# Patient Record
Sex: Male | Born: 1965 | Race: White | Hispanic: No | Marital: Married | Smoking: Never smoker
Health system: Southern US, Community
[De-identification: ages and names within clinical notes are randomized; demographics above are authoritative.]

## PROBLEM LIST (undated history)

## (undated) DIAGNOSIS — I10 Essential (primary) hypertension: Secondary | ICD-10-CM

## (undated) DIAGNOSIS — F329 Major depressive disorder, single episode, unspecified: Secondary | ICD-10-CM

## (undated) DIAGNOSIS — F419 Anxiety disorder, unspecified: Secondary | ICD-10-CM

## (undated) DIAGNOSIS — M4850XA Collapsed vertebra, not elsewhere classified, site unspecified, initial encounter for fracture: Secondary | ICD-10-CM

## (undated) DIAGNOSIS — G4700 Insomnia, unspecified: Secondary | ICD-10-CM

## (undated) DIAGNOSIS — J309 Allergic rhinitis, unspecified: Secondary | ICD-10-CM

## (undated) DIAGNOSIS — F32A Depression, unspecified: Secondary | ICD-10-CM

## (undated) HISTORY — DX: Essential (primary) hypertension: I10

## (undated) HISTORY — PX: TONSILLECTOMY: SHX5217

## (undated) HISTORY — DX: Insomnia, unspecified: G47.00

## (undated) HISTORY — DX: Anxiety disorder, unspecified: F41.9

## (undated) HISTORY — DX: Major depressive disorder, single episode, unspecified: F32.9

## (undated) HISTORY — PX: FINGER REPLANTATION: SHX639

## (undated) HISTORY — DX: Allergic rhinitis, unspecified: J30.9

## (undated) HISTORY — DX: Depression, unspecified: F32.A

## (undated) HISTORY — DX: Collapsed vertebra, not elsewhere classified, site unspecified, initial encounter for fracture: M48.50XA

---

## 2005-06-07 ENCOUNTER — Emergency Department: Payer: Self-pay | Admitting: Emergency Medicine

## 2006-07-02 ENCOUNTER — Emergency Department: Payer: Self-pay | Admitting: Unknown Physician Specialty

## 2015-04-25 ENCOUNTER — Other Ambulatory Visit: Payer: Self-pay

## 2015-04-25 ENCOUNTER — Telehealth: Payer: Self-pay | Admitting: Unknown Physician Specialty

## 2015-04-25 MED ORDER — SERTRALINE HCL 100 MG PO TABS: 100.0000 mg | ORAL_TABLET | Freq: Every day | ORAL | Status: DC

## 2015-04-25 NOTE — Telephone Encounter (Signed)
E-fax came through for refill on: Rx: Sertraline Copy in basket.

## 2015-04-26 NOTE — Telephone Encounter (Signed)
Duplicate message. 

## 2015-08-24 ENCOUNTER — Other Ambulatory Visit: Payer: Self-pay | Admitting: Unknown Physician Specialty

## 2015-09-29 ENCOUNTER — Other Ambulatory Visit: Payer: Self-pay | Admitting: Unknown Physician Specialty

## 2015-12-18 ENCOUNTER — Emergency Department: Payer: BLUE CROSS/BLUE SHIELD

## 2015-12-18 ENCOUNTER — Emergency Department
Admission: EM | Admit: 2015-12-18 | Discharge: 2015-12-19 | Disposition: A | Discharge status: Home / Self Care | Payer: BLUE CROSS/BLUE SHIELD | Attending: Emergency Medicine | Admitting: Emergency Medicine

## 2015-12-18 ENCOUNTER — Encounter: Payer: Self-pay | Admitting: Emergency Medicine

## 2015-12-18 DIAGNOSIS — R109 Unspecified abdominal pain: Secondary | ICD-10-CM | POA: Diagnosis present

## 2015-12-18 DIAGNOSIS — Z79899 Other long term (current) drug therapy: Secondary | ICD-10-CM | POA: Diagnosis not present

## 2015-12-18 DIAGNOSIS — M545 Low back pain: Secondary | ICD-10-CM | POA: Diagnosis not present

## 2015-12-18 DIAGNOSIS — F17200 Nicotine dependence, unspecified, uncomplicated: Secondary | ICD-10-CM | POA: Insufficient documentation

## 2015-12-18 LAB — BASIC METABOLIC PANEL
ANION GAP: 6 (ref 5–15)
BUN: 21 mg/dL — ABNORMAL HIGH (ref 6–20)
CALCIUM: 8.9 mg/dL (ref 8.9–10.3)
CO2: 27 mmol/L (ref 22–32)
CREATININE: 1.04 mg/dL (ref 0.61–1.24)
Chloride: 102 mmol/L (ref 101–111)
Glucose, Bld: 120 mg/dL — ABNORMAL HIGH (ref 65–99)
Potassium: 4 mmol/L (ref 3.5–5.1)
SODIUM: 135 mmol/L (ref 135–145)

## 2015-12-18 LAB — URINALYSIS COMPLETE WITH MICROSCOPIC (ARMC ONLY)
Bacteria, UA: NONE SEEN
Bilirubin Urine: NEGATIVE
GLUCOSE, UA: NEGATIVE mg/dL
KETONES UR: NEGATIVE mg/dL
Leukocytes, UA: NEGATIVE
Nitrite: NEGATIVE
PROTEIN: NEGATIVE mg/dL
SQUAMOUS EPITHELIAL / LPF: NONE SEEN
Specific Gravity, Urine: 1.011 (ref 1.005–1.030)
pH: 6 (ref 5.0–8.0)

## 2015-12-18 LAB — CBC
HCT: 50.1 % (ref 40.0–52.0)
HEMOGLOBIN: 16.6 g/dL (ref 13.0–18.0)
MCH: 30.7 pg (ref 26.0–34.0)
MCHC: 33 g/dL (ref 32.0–36.0)
MCV: 92.9 fL (ref 80.0–100.0)
PLATELETS: 246 10*3/uL (ref 150–440)
RBC: 5.39 MIL/uL (ref 4.40–5.90)
RDW: 15.4 % — ABNORMAL HIGH (ref 11.5–14.5)
WBC: 12.8 10*3/uL — AB (ref 3.8–10.6)

## 2015-12-18 MED ORDER — SODIUM CHLORIDE 0.9 % IV SOLN
1000.0000 mL | Freq: Once | INTRAVENOUS | Status: AC
  Administered 2015-12-18: 1000 mL via INTRAVENOUS

## 2015-12-18 MED ORDER — IOPAMIDOL (ISOVUE-300) INJECTION 61%
100.0000 mL | Freq: Once | INTRAVENOUS | Status: AC | PRN
  Administered 2015-12-18: 100 mL via INTRAVENOUS

## 2015-12-18 MED ORDER — HYDROMORPHONE HCL 1 MG/ML IJ SOLN
INTRAMUSCULAR | Status: AC
  Administered 2015-12-18: 1 mg via INTRAVENOUS
  Filled 2015-12-18: qty 1

## 2015-12-18 MED ORDER — HYDROMORPHONE HCL 1 MG/ML IJ SOLN
1.0000 mg | Freq: Once | INTRAMUSCULAR | Status: AC
  Administered 2015-12-18: 1 mg via INTRAVENOUS
  Filled 2015-12-18: qty 1

## 2015-12-18 MED ORDER — ONDANSETRON HCL 4 MG/2ML IJ SOLN
4.0000 mg | Freq: Once | INTRAMUSCULAR | Status: AC
  Administered 2015-12-18: 4 mg via INTRAVENOUS
  Filled 2015-12-18: qty 2

## 2015-12-18 MED ORDER — OXYCODONE-ACETAMINOPHEN 7.5-325 MG PO TABS
1.0000 | ORAL_TABLET | ORAL | Status: AC | PRN
Start: 2015-12-18 — End: 2016-12-17

## 2015-12-18 MED ORDER — DEXTROSE 5 % IV SOLN
1.0000 g | Freq: Once | INTRAVENOUS | Status: AC
  Administered 2015-12-19: 1 g via INTRAVENOUS
  Filled 2015-12-18: qty 10

## 2015-12-18 MED ORDER — HYDROMORPHONE HCL 1 MG/ML IJ SOLN
1.0000 mg | Freq: Once | INTRAMUSCULAR | Status: AC
  Administered 2015-12-18: 1 mg via INTRAVENOUS

## 2015-12-18 MED ORDER — DIATRIZOATE MEGLUMINE & SODIUM 66-10 % PO SOLN
15.0000 mL | Freq: Once | ORAL | Status: AC
  Administered 2015-12-18: 15 mL via ORAL

## 2015-12-18 MED ORDER — DIAZEPAM 5 MG PO TABS: 5.0000 mg | ORAL_TABLET | Freq: Three times a day (TID) | ORAL | Status: DC | PRN

## 2015-12-18 MED ORDER — CIPROFLOXACIN HCL 500 MG PO TABS: 500.0000 mg | ORAL_TABLET | Freq: Two times a day (BID) | ORAL | Status: AC

## 2015-12-18 NOTE — ED Notes (Signed)
Called CT to inform them pt has completed the oral contrast.

## 2015-12-18 NOTE — Discharge Instructions (Signed)

## 2015-12-18 NOTE — ED Notes (Signed)
Pt states right flank pain that began yesterday. Pt appears in distress in triage. Pt states pain is "intense" and constant. Pt states last urination approx one hour pta. Pt denies nausea, dizziness, diarrhea, fever.

## 2015-12-18 NOTE — ED Notes (Signed)
Patient transported to CT 

## 2015-12-18 NOTE — ED Notes (Signed)
Discussed IV options with patient, AC placement preferred. 

## 2015-12-18 NOTE — ED Provider Notes (Signed)
[facility] Emergency Department Provider Note     Time seen: ----------------------------------------- 8:53 PM on 12/18/2015 -----------------------------------------    I have reviewed the triage vital signs and the nursing notes.   HISTORY  Chief Complaint Flank Pain    HPI Hector Buck is a 50 y.o. male who presents to ER with severe left flank pain that began yesterday. Patient describes it as intense and constant. Nothing makes it better or worse. Patient last urinated about 1 hour prior to arrival. He denies any nausea, dizziness, diarrhea or fever. Patient has never had pain like this before, states he had a prostate infection years ago but has never had symptoms consistent with what he has now.   History reviewed. No pertinent past medical history.  There are no active problems to display for this patient.   History reviewed. No pertinent past surgical history.  Allergies Review of patient's allergies indicates no known allergies.  Social History Social History  Substance Use Topics  . Smoking status: Never Smoker   . Smokeless tobacco: Current User  . Alcohol Use: No    Review of Systems Constitutional: Negative for fever. Eyes: Negative for visual changes. ENT: Negative for sore throat. Cardiovascular: Negative for chest pain. Respiratory: Negative for shortness of breath. Gastrointestinal: Positive for left flank pain Genitourinary: Negative for dysuria. Musculoskeletal: Negative for back pain. Skin: Negative for rash. Neurological: Negative for headaches, focal weakness or numbness.  10-point ROS otherwise negative.  ____________________________________________   PHYSICAL EXAM:  VITAL SIGNS: ED Triage Vitals  Enc Vitals Group     BP 12/18/15 2046 191/106 mmHg     Pulse Rate 12/18/15 2046 110     Resp 12/18/15 2046 26     Temp 12/18/15 2046 98.9 F (37.2 C)     Temp Source 12/18/15 2046 Oral     SpO2 12/18/15  2046 100 %     Weight 12/18/15 2046 204 lb (92.534 kg)     Height 12/18/15 2046 5\' 8"  (1.727 m)     Head Cir --      Peak Flow --      Pain Score 12/18/15 2046 10     Pain Loc --      Pain Edu? --      Excl. in GC? --     Constitutional: Alert and oriented.Mild to moderate distress Eyes: Conjunctivae are normal. PERRL. Normal extraocular movements. ENT   Head: Normocephalic and atraumatic.   Nose: No congestion/rhinnorhea.   Mouth/Throat: Mucous membranes are moist.   Neck: No stridor. Cardiovascular: Normal rate, regular rhythm. Normal and symmetric distal pulses are present in all extremities. No murmurs, rubs, or gallops. Respiratory: Normal respiratory effort without tachypnea nor retractions. Breath sounds are clear and equal bilaterally. No wheezes/rales/rhonchi. Gastrointestinal: Mild left flank tenderness, no rebound or guarding. Normal bowel sounds. Musculoskeletal: Nontender with normal range of motion in all extremities. No joint effusions.  No lower extremity tenderness nor edema. Neurologic:  Normal speech and language. No gross focal neurologic deficits are appreciated.  Skin:  Skin is warm, dry and intact. No rash noted. Psychiatric: Mood and affect are normal. Speech and behavior are normal. Patient exhibits appropriate insight and judgment. ____________________________________________  ED COURSE:  Pertinent labs & imaging results that were available during my care of the patient were reviewed by me and considered in my medical decision making (see chart for details). Patient likely renal colic, we'll give IV fluids, pain medicine and reevaluate. He will likely need CT  imaging. ____________________________________________    LABS (pertinent positives/negatives)  Labs Reviewed  BASIC METABOLIC PANEL - Abnormal; Notable for the following:    Glucose, Bld 120 (*)    BUN 21 (*)    All other components within normal limits  CBC - Abnormal; Notable for  the following:    WBC 12.8 (*)    RDW 15.4 (*)    All other components within normal limits  URINALYSIS COMPLETEWITH MICROSCOPIC ([facility] ONLY) - Abnormal; Notable for the following:    Color, Urine STRAW (*)    APPearance CLEAR (*)    Hgb urine dipstick 2+ (*)    All other components within normal limits    RADIOLOGY Images were viewed by me  CT renal protocol IMPRESSION: 1. No acute abnormality seen within the abdomen or pelvis. 2. Enlarged prostate noted. 3. Tiny left renal cyst seen. ____________________________________________  FINAL ASSESSMENT AND PLAN  Low back pain  Plan: Patient with labs and imaging as dictated above. No clear etiology for his symptoms is identified. He'll be discharged with Cipro and given pain medication. Patient does have a history of prostatitis, I will cover him for same. He is stable for outpatient follow-up with his doctor.   Emily Filbert, MD   Emily Filbert, MD 12/18/15 [phone]

## 2015-12-18 NOTE — ED Notes (Signed)
Patient returned from CT

## 2015-12-19 MED ORDER — ONDANSETRON HCL 4 MG/2ML IJ SOLN
INTRAMUSCULAR | Status: AC
  Administered 2015-12-19: 4 mg via INTRAVENOUS
  Filled 2015-12-19: qty 2

## 2015-12-19 MED ORDER — ONDANSETRON HCL 4 MG/2ML IJ SOLN
4.0000 mg | Freq: Once | INTRAMUSCULAR | Status: AC
  Administered 2015-12-19: 4 mg via INTRAVENOUS

## 2016-02-23 ENCOUNTER — Other Ambulatory Visit: Payer: Self-pay | Admitting: Unknown Physician Specialty

## 2016-08-27 ENCOUNTER — Encounter: Payer: Self-pay | Admitting: Family Medicine

## 2016-08-27 ENCOUNTER — Ambulatory Visit (INDEPENDENT_AMBULATORY_CARE_PROVIDER_SITE_OTHER): Payer: BLUE CROSS/BLUE SHIELD | Admitting: Family Medicine

## 2016-08-27 VITALS — BP 156/93 | HR 75 | Temp 98.4°F | Ht 68.4 in | Wt 206.4 lb

## 2016-08-27 DIAGNOSIS — J309 Allergic rhinitis, unspecified: Secondary | ICD-10-CM | POA: Insufficient documentation

## 2016-08-27 DIAGNOSIS — G4700 Insomnia, unspecified: Secondary | ICD-10-CM

## 2016-08-27 DIAGNOSIS — J302 Other seasonal allergic rhinitis: Secondary | ICD-10-CM

## 2016-08-27 DIAGNOSIS — J0100 Acute maxillary sinusitis, unspecified: Secondary | ICD-10-CM | POA: Diagnosis not present

## 2016-08-27 DIAGNOSIS — F329 Major depressive disorder, single episode, unspecified: Secondary | ICD-10-CM | POA: Insufficient documentation

## 2016-08-27 DIAGNOSIS — F419 Anxiety disorder, unspecified: Secondary | ICD-10-CM | POA: Insufficient documentation

## 2016-08-27 DIAGNOSIS — F32A Depression, unspecified: Secondary | ICD-10-CM | POA: Insufficient documentation

## 2016-08-27 DIAGNOSIS — I10 Essential (primary) hypertension: Secondary | ICD-10-CM | POA: Insufficient documentation

## 2016-08-27 MED ORDER — BENZONATATE 100 MG PO CAPS: 200.0000 mg | ORAL_CAPSULE | Freq: Three times a day (TID) | ORAL | 0 refills | Status: DC | PRN

## 2016-08-27 MED ORDER — SUVOREXANT 10 MG PO TABS: 10.0000 mg | ORAL_TABLET | Freq: Every evening | ORAL | 0 refills | Status: DC | PRN

## 2016-08-27 MED ORDER — AMOXICILLIN-POT CLAVULANATE 875-125 MG PO TABS: 1.0000 | ORAL_TABLET | Freq: Two times a day (BID) | ORAL | 0 refills | Status: DC

## 2016-08-27 MED ORDER — HYDROCOD POLST-CPM POLST ER 10-8 MG/5ML PO SUER: 5.0000 mL | Freq: Two times a day (BID) | ORAL | 0 refills | Status: DC | PRN
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
## 2016-08-27 NOTE — Assessment & Plan Note (Signed)
Will do trial of belsomra. Follow up for further refills. Savings card given today.

## 2016-08-27 NOTE — Patient Instructions (Signed)
Follow up as needed

## 2016-08-28 ENCOUNTER — Telehealth: Payer: Self-pay | Admitting: Family Medicine

## 2016-08-28 MED ORDER — AMOXICILLIN 875 MG PO TABS: 875.0000 mg | ORAL_TABLET | Freq: Two times a day (BID) | ORAL | 0 refills | Status: DC

## 2016-08-28 NOTE — Telephone Encounter (Signed)
He says that augmentin gives him an upset stomach and wants to know if it can be changed to plain amoxil. He states he has taken the Augmentin with food and it still upsets his stomach.

## 2016-08-28 NOTE — Telephone Encounter (Signed)
Plain amoxicillin sent

## 2016-09-07 ENCOUNTER — Telehealth: Payer: Self-pay | Admitting: Unknown Physician Specialty

## 2016-09-07 MED ORDER — AZITHROMYCIN 250 MG PO TABS: ORAL_TABLET | ORAL | 0 refills | Status: DC

## 2016-09-07 MED ORDER — BENZONATATE 100 MG PO CAPS: 200.0000 mg | ORAL_CAPSULE | Freq: Three times a day (TID) | ORAL | 0 refills | Status: DC | PRN

## 2016-09-07 NOTE — Telephone Encounter (Signed)
Patient saw [name] 2-weeks ago for chest congestion, cold. Patient was prescribed amoxicillin and Tussionex with codeine for his cough  and has completed it but is no better.  He is requesting another round of antibiotic and cough medicine   [facility]  Thank You Clydie BraunKaren

## 2016-09-07 NOTE — Telephone Encounter (Signed)
Azithromycin and more tessalon perles sent, follow up if no improvement after this round

## 2016-09-07 NOTE — Telephone Encounter (Signed)
Routing to provider  

## 2016-09-14 ENCOUNTER — Other Ambulatory Visit: Payer: Self-pay | Admitting: Unknown Physician Specialty

## 2016-11-07 ENCOUNTER — Telehealth: Payer: Self-pay | Admitting: Unknown Physician Specialty

## 2016-11-07 NOTE — Telephone Encounter (Signed)
Patient needs to be seen for another prescription of amoxil. Hector Buck, patient states his sleep medication is not working. Does he need to be seen for this?

## 2016-11-07 NOTE — Telephone Encounter (Signed)
Patient stated that he just wants to be put back on previous sleep medication, Ambien. Can we do that or does he need to be seen?

## 2016-11-07 NOTE — Telephone Encounter (Signed)
Routing back to scheduler. Rosey Batheresa, please call and schedule the patient an appointment for both refills.

## 2016-11-07 NOTE — Telephone Encounter (Signed)
Yes, will need to be seen.

## 2016-11-08 MED ORDER — ZOLPIDEM TARTRATE 10 MG PO TABS: 10.0000 mg | ORAL_TABLET | Freq: Every evening | ORAL | 0 refills | Status: DC | PRN

## 2016-11-08 NOTE — Telephone Encounter (Signed)
Will refill the ambien, but at previous visit he stated he was not content with this therapy so if he wants to discuss other options he will need an appt.

## 2017-04-17 ENCOUNTER — Other Ambulatory Visit: Payer: Self-pay | Admitting: Unknown Physician Specialty

## 2017-07-15 ENCOUNTER — Ambulatory Visit
Admission: RE | Admit: 2017-07-15 | Discharge: 2017-07-15 | Disposition: A | Discharge status: Home / Self Care | Payer: BLUE CROSS/BLUE SHIELD | Source: Ambulatory Visit | Attending: Unknown Physician Specialty | Admitting: Unknown Physician Specialty

## 2017-07-15 ENCOUNTER — Encounter: Payer: Self-pay | Admitting: Unknown Physician Specialty

## 2017-07-15 ENCOUNTER — Ambulatory Visit (INDEPENDENT_AMBULATORY_CARE_PROVIDER_SITE_OTHER): Payer: BLUE CROSS/BLUE SHIELD | Admitting: Unknown Physician Specialty

## 2017-07-15 ENCOUNTER — Telehealth: Payer: Self-pay

## 2017-07-15 VITALS — BP 143/74 | HR 75 | Temp 98.0°F | Wt 211.6 lb

## 2017-07-15 DIAGNOSIS — R05 Cough: Secondary | ICD-10-CM | POA: Diagnosis present

## 2017-07-15 DIAGNOSIS — G4700 Insomnia, unspecified: Secondary | ICD-10-CM | POA: Diagnosis not present

## 2017-07-15 DIAGNOSIS — J45901 Unspecified asthma with (acute) exacerbation: Secondary | ICD-10-CM

## 2017-07-15 DIAGNOSIS — J0100 Acute maxillary sinusitis, unspecified: Secondary | ICD-10-CM

## 2017-07-15 MED ORDER — HYDROCOD POLST-CPM POLST ER 10-8 MG/5ML PO SUER: 5.0000 mL | Freq: Two times a day (BID) | ORAL | 0 refills | Status: DC | PRN

## 2017-07-15 MED ORDER — ALBUTEROL SULFATE HFA 108 (90 BASE) MCG/ACT IN AERS: 2.0000 | INHALATION_SPRAY | Freq: Four times a day (QID) | RESPIRATORY_TRACT | 2 refills | Status: DC | PRN

## 2017-07-15 MED ORDER — ZOLPIDEM TARTRATE 10 MG PO TABS: 10.0000 mg | ORAL_TABLET | Freq: Every evening | ORAL | 0 refills | Status: DC | PRN

## 2017-07-15 MED ORDER — AMOXICILLIN 875 MG PO TABS: 875.0000 mg | ORAL_TABLET | Freq: Two times a day (BID) | ORAL | 0 refills | Status: DC

## 2017-07-15 MED ORDER — PREDNISONE 20 MG PO TABS: 40.0000 mg | ORAL_TABLET | Freq: Every day | ORAL | 0 refills | Status: DC

## 2017-07-15 NOTE — Progress Notes (Signed)
BP (!) 143/74   Pulse 75   Temp 98 F (36.7 C)   Wt 211 lb 9.6 oz (96 kg)   SpO2 98%   BMI 31.80 kg/m    Subjective:    Patient ID: Hector Buck, male    DOB: Apr 27, 1966, 51 y.o.   MRN: [id number]  HPI: Hector Buck is a 51 y.o. male  Chief Complaint  Patient presents with  . URI    pt states he has had a cough, chest congestion, nasal congestion, sinus pressure, and some body aches for 4 days. States he has been using flonase, taking robitussin DM, and mucinex    URI   This is a new problem. Episode onset: 4 days. The problem has been gradually worsening. There has been no fever. Associated symptoms include congestion, coughing, headaches and rhinorrhea. Pertinent negatives include no sore throat. He has tried antihistamine and decongestant for the symptoms. The treatment provided no relief.   Insomnia Would like a refill of Ambien.  Only takes it on weekends with trouble staying asleep.  States he doesn't take it every weekend.  Trouble staying asleep  Relevant past medical, surgical, family and social history reviewed and updated as indicated. Interim medical history since our last visit reviewed. Allergies and medications reviewed and updated.  Review of Systems  HENT: Positive for congestion and rhinorrhea. Negative for sore throat.   Respiratory: Positive for cough.   Neurological: Positive for headaches.   Per HPI unless specifically indicated above     Objective:    BP (!) 143/74   Pulse 75   Temp 98 F (36.7 C)   Wt 211 lb 9.6 oz (96 kg)   SpO2 98%   BMI 31.80 kg/m   Wt Readings from Last 3 Encounters:  07/15/17 211 lb 9.6 oz (96 kg)  08/27/16 206 lb 6.4 oz (93.6 kg)  12/18/15 204 lb (92.5 kg)    Physical Exam  Constitutional: He is oriented to person, place, and time. He appears well-developed and well-nourished. No distress.  HENT:  Head: Normocephalic and atraumatic.  Right Ear: Tympanic membrane and ear canal normal.  Left Ear: Tympanic  membrane and ear canal normal.  Nose: Rhinorrhea present. Right sinus exhibits no maxillary sinus tenderness and no frontal sinus tenderness. Left sinus exhibits no maxillary sinus tenderness and no frontal sinus tenderness.  Mouth/Throat: Uvula is midline. Posterior oropharyngeal edema present.  Eyes: Conjunctivae and lids are normal. Right eye exhibits no discharge. Left eye exhibits no discharge. No scleral icterus.  Neck: Neck supple.  Cardiovascular: Normal rate, regular rhythm and normal heart sounds.   Pulmonary/Chest: Effort normal. No respiratory distress. He has wheezes. He has rhonchi.  Abdominal: Normal appearance. There is no splenomegaly or hepatomegaly.  Musculoskeletal: Normal range of motion.  Neurological: He is alert and oriented to person, place, and time.  Skin: Skin is warm, dry and intact. No rash noted. No pallor.  Psychiatric: He has a normal mood and affect. His behavior is normal. Judgment and thought content normal.  Nursing note and vitals reviewed.    Assessment & Plan:   Problem List Items Addressed This Visit      Unprioritized   Insomnia    Pt ed on sleep hygeine.  Rx for occasional Ambien       Other Visit Diagnoses    Acute non-recurrent maxillary sinusitis    -  Primary   Relevant Medications   amoxicillin (AMOXIL) 875 MG tablet   predniSONE (DELTASONE)  20 MG tablet   chlorpheniramine-HYDROcodone (TUSSIONEX PENNKINETIC ER) 10-8 MG/5ML SUER   Other Relevant Orders   DG Chest 2 View (Completed)   Reactive airway disease with wheezing with acute exacerbation, unspecified asthma severity, unspecified whether persistent       Pt with quite a bit of adventitious sounds.  Rx for Amoxil.  Chest x-ray. Prednisone.  Rx for inhaler q 4 hours prn   Relevant Orders   DG Chest 2 View (Completed)       Follow up plan: Return for physical.

## 2017-07-15 NOTE — Assessment & Plan Note (Signed)
Pt ed on sleep hygeine.  Rx for occasional Ambien

## 2017-07-15 NOTE — Telephone Encounter (Signed)
Copied from CRM #[id number]. Topic: Inquiry >> Jul 15, 2017  2:01 PM Yvonna Alanisobinson, Andra M wrote: Reason for CRM: Patient wants someone to call him regarding an Inhaler Prescription that was to be sent earlier today 07/15/2017...  [facility] [place] [facility]. Spoke with Pharmacist and he will get order ready for pt to pick up in 15 minutes. Pt called back and informed. Pt states understanding.

## 2017-07-18 ENCOUNTER — Telehealth: Payer: Self-pay

## 2017-07-18 NOTE — Telephone Encounter (Signed)
Copied from CRM #[id number]. Topic: Quick Communication - Other Results >> Jul 16, 2017  1:28 PM Louie BunPalacios Medina, Rosey Batheresa D wrote: Patient called and would like his results for his chest x-ray that he had done yesterday. Please call patient, thanks.   Called and spoke to patient. Result note within the chest x-ray documented by Elnita MaxwellCheryl was provided to the patient. Patient verbalized understanding.

## 2017-07-24 ENCOUNTER — Other Ambulatory Visit: Payer: Self-pay | Admitting: Unknown Physician Specialty

## 2017-07-30 ENCOUNTER — Telehealth: Payer: Self-pay | Admitting: Unknown Physician Specialty

## 2017-07-30 MED ORDER — CEFDINIR 300 MG PO CAPS: 300.0000 mg | ORAL_CAPSULE | Freq: Two times a day (BID) | ORAL | 0 refills | Status: AC

## 2017-07-30 NOTE — Telephone Encounter (Signed)
Copied from CRM [id number]. Topic: Quick Communication - See Telephone Encounter >> Jul 30, 2017 11:26 AM Arlyss Gandyichardson, Sarae Nicholes N, NT wrote: CRM for notification. See Telephone encounter for: Patient is wanting to know if Gabriel CirriCheryl Wicker can call something else in for him for his sinus infection that he was seen for? He has completed all the other meds besides the inhaler and still experiencing some green/yellow drainage. Please give pt a call.  07/30/17.

## 2017-07-30 NOTE — Telephone Encounter (Signed)
Routing to provider. Patient seen 07/15/17.

## 2017-07-30 NOTE — Telephone Encounter (Signed)
Called and left patient a VM letting him know that Bourbonnaisheryl sent him in another medication. Asked for patient to please call back with any questions or concerns.

## 2017-07-30 NOTE — Telephone Encounter (Signed)
Sent in rx for BorgWarnermnicef

## 2017-09-12 ENCOUNTER — Telehealth: Payer: Self-pay | Admitting: Unknown Physician Specialty

## 2017-09-12 NOTE — Telephone Encounter (Signed)
Ambien refill. Last OV and refill on  07/15/17

## 2017-09-12 NOTE — Telephone Encounter (Signed)
Copied from CRM #[id number]. Topic: Quick Communication - See Telephone Encounter >> Sep 12, 2017  4:11 PM Floria RavelingStovall, Shana A wrote: CRM for notification. See Telephone encounter for: pt called in and would like a refill on his zolpidem (AMBIEN) 10 MG tablet [[id number]][[id number]] ENDED   [facility] [address] [address] [address]   09/12/17.

## 2017-09-13 NOTE — Telephone Encounter (Signed)
Please route accordingly, this is not a patient at [facility]. Thank you

## 2017-09-14 ENCOUNTER — Other Ambulatory Visit: Payer: Self-pay | Admitting: Unknown Physician Specialty

## 2017-09-16 NOTE — Telephone Encounter (Signed)
Request for controlled subsance 

## 2017-09-18 ENCOUNTER — Other Ambulatory Visit: Payer: Self-pay | Admitting: Unknown Physician Specialty

## 2017-10-21 ENCOUNTER — Encounter: Payer: BLUE CROSS/BLUE SHIELD | Admitting: Unknown Physician Specialty

## 2017-11-29 ENCOUNTER — Other Ambulatory Visit: Payer: Self-pay | Admitting: Family Medicine

## 2017-12-21 ENCOUNTER — Other Ambulatory Visit: Payer: Self-pay | Admitting: Unknown Physician Specialty

## 2018-01-28 ENCOUNTER — Other Ambulatory Visit: Payer: Self-pay | Admitting: Unknown Physician Specialty

## 2018-01-28 NOTE — Telephone Encounter (Signed)
Rx refill  Request Ambien  LOV 07/15/2017  Pharmacy on File

## 2018-01-28 NOTE — Telephone Encounter (Signed)
Rx refill  Request Ambien ° LOV 07/15/2017 ° Pharmacy on File  °

## 2018-03-09 ENCOUNTER — Other Ambulatory Visit: Payer: Self-pay | Admitting: Unknown Physician Specialty

## 2018-03-10 NOTE — Telephone Encounter (Signed)
Last office visit 07/15/17 Last refill on Fluticasone 50 mcg nasal spray; 10/31//18; 17 gm. RF x 4  PCP:  Pauletta Brownsheryl Wicker [facility]am[facility] [address] [place]  Refilled x 1 mo, as pt. overdue for a physical

## 2018-03-10 NOTE — Telephone Encounter (Signed)
Attempted to contact pt.; left message for pt. To call and schedule a physical

## 2018-04-13 ENCOUNTER — Other Ambulatory Visit: Payer: Self-pay | Admitting: Unknown Physician Specialty

## 2018-04-17 ENCOUNTER — Other Ambulatory Visit: Payer: Self-pay | Admitting: Unknown Physician Specialty

## 2018-04-18 NOTE — Telephone Encounter (Signed)
Patient called, left VM to return call to the office to schedule a physical with Hector Buck before more refills of medications.

## 2018-10-27 ENCOUNTER — Encounter: Payer: Self-pay | Admitting: Family Medicine

## 2018-10-27 ENCOUNTER — Ambulatory Visit: Payer: BLUE CROSS/BLUE SHIELD | Admitting: Family Medicine

## 2018-10-27 VITALS — BP 173/82 | HR 73 | Temp 99.4°F | Ht 68.4 in | Wt 206.6 lb

## 2018-10-27 DIAGNOSIS — R509 Fever, unspecified: Secondary | ICD-10-CM

## 2018-10-27 DIAGNOSIS — J101 Influenza due to other identified influenza virus with other respiratory manifestations: Secondary | ICD-10-CM | POA: Diagnosis not present

## 2018-10-27 DIAGNOSIS — R05 Cough: Secondary | ICD-10-CM | POA: Diagnosis not present

## 2018-10-27 DIAGNOSIS — R062 Wheezing: Secondary | ICD-10-CM

## 2018-10-27 DIAGNOSIS — R059 Cough, unspecified: Secondary | ICD-10-CM

## 2018-10-27 LAB — VERITOR FLU A/B WAIVED
INFLUENZA A: POSITIVE — AB
INFLUENZA B: NEGATIVE

## 2018-10-27 MED ORDER — ALBUTEROL SULFATE (2.5 MG/3ML) 0.083% IN NEBU: 2.5000 mg | INHALATION_SOLUTION | Freq: Once | RESPIRATORY_TRACT | Status: AC

## 2018-10-27 MED ORDER — OSELTAMIVIR PHOSPHATE 75 MG PO CAPS: 75.0000 mg | ORAL_CAPSULE | Freq: Two times a day (BID) | ORAL | 0 refills | Status: AC

## 2018-10-27 MED ORDER — HYDROCOD POLST-CPM POLST ER 10-8 MG/5ML PO SUER
5.0000 mL | Freq: Two times a day (BID) | ORAL | 0 refills | Status: AC | PRN
Start: 2018-10-27 — End: ?

## 2018-10-27 MED ORDER — ALBUTEROL SULFATE HFA 108 (90 BASE) MCG/ACT IN AERS
2.0000 | INHALATION_SPRAY | Freq: Four times a day (QID) | RESPIRATORY_TRACT | 2 refills | Status: DC | PRN
Start: 2018-10-27 — End: 2019-01-06

## 2018-10-27 MED ORDER — BENZONATATE 200 MG PO CAPS: 200.0000 mg | ORAL_CAPSULE | Freq: Three times a day (TID) | ORAL | 0 refills | Status: AC | PRN

## 2018-10-27 NOTE — Progress Notes (Signed)
BP (!) 173/82 (BP Location: Left Arm, Patient Position: Sitting, Cuff Size: Large)   Pulse 73   Temp 99.4 F (37.4 C)   Ht 5' 8.4" (1.737 m)   Wt 206 lb 9 oz (93.7 kg)   BMI 31.04 kg/m    Subjective:    Patient ID: Hector Buck, male    DOB: April 13, 1966, 53 y.o.   MRN: [id number]  HPI: Hector Buck is a 53 y.o. male  Chief Complaint  Patient presents with  . URI    X 2 days  . Allergies    refill on zyrtec and flonase  . Insomnia    refill on ambien   Here today with 2 day hx of fevers, chills, generalized body aches, significant cough and wheezing, SOB, chest tightness. Taking OTC cold and flu medicines without relief. Does have a hx of allergies and reactive airway dz. Several sick contacts.   Relevant past medical, surgical, family and social history reviewed and updated as indicated. Interim medical history since our last visit reviewed. Allergies and medications reviewed and updated.  Review of Systems  Per HPI unless specifically indicated above     Objective:    BP (!) 173/82 (BP Location: Left Arm, Patient Position: Sitting, Cuff Size: Large)   Pulse 73   Temp 99.4 F (37.4 C)   Ht 5' 8.4" (1.737 m)   Wt 206 lb 9 oz (93.7 kg)   BMI 31.04 kg/m   Wt Readings from Last 3 Encounters:  10/27/18 206 lb 9 oz (93.7 kg)  07/15/17 211 lb 9.6 oz (96 kg)  08/27/16 206 lb 6.4 oz (93.6 kg)    Physical Exam Vitals signs and nursing note reviewed.  Constitutional:      Appearance: He is well-developed.  HENT:     Head: Atraumatic.     Right Ear: External ear normal.     Left Ear: External ear normal.     Nose: Congestion present.     Mouth/Throat:     Pharynx: Posterior oropharyngeal erythema present. No oropharyngeal exudate.  Eyes:     Conjunctiva/sclera: Conjunctivae normal.     Pupils: Pupils are equal, round, and reactive to light.  Neck:     Musculoskeletal: Normal range of motion and neck supple.  Cardiovascular:     Rate and Rhythm: Normal  rate and regular rhythm.  Pulmonary:     Effort: Pulmonary effort is normal. No respiratory distress.     Breath sounds: Wheezing (significant, diffuse) present. No rales.  Musculoskeletal: Normal range of motion.  Lymphadenopathy:     Cervical: No cervical adenopathy.  Skin:    General: Skin is warm and dry.  Neurological:     Mental Status: He is alert and oriented to person, place, and time.  Psychiatric:        Behavior: Behavior normal.     Results for orders placed or performed in visit on 10/27/18  Veritor Flu A/B Waived  Result Value Ref Range   Influenza A Positive (A) Negative   Influenza B Negative Negative      Assessment & Plan:   Problem List Items Addressed This Visit    None    Visit Diagnoses    Influenza A    -  Primary   Tamiflu sent, tessalon and tussionex for cough. Albuterol inhaler refilled. Sedation and return precautions reviewed   Relevant Medications   oseltamivir (TAMIFLU) 75 MG capsule   Cough       Relevant  Orders   Veritor Flu A/B Waived (Completed)   Fever, unspecified fever cause       Relevant Orders   Veritor Flu A/B Waived (Completed)   Wheezing       Nebulizer tx in office given with mild relief. Call in 2-3 days if not significantly improving   Relevant Medications   albuterol (PROVENTIL) (2.5 MG/3ML) 0.083% nebulizer solution 2.5 mg       Follow up plan: Return if symptoms worsen or fail to improve.

## 2018-10-30 ENCOUNTER — Telehealth: Payer: Self-pay | Admitting: Family Medicine

## 2018-10-30 MED ORDER — PREDNISONE 10 MG PO TABS: ORAL_TABLET | ORAL | 0 refills | Status: AC

## 2018-10-30 MED ORDER — AZITHROMYCIN 250 MG PO TABS: ORAL_TABLET | ORAL | 0 refills | Status: AC

## 2018-10-30 NOTE — Telephone Encounter (Signed)
Copied from CRM [id number]. Topic: General - Inquiry >> Oct 30, 2018  8:32 AM Maia Petties wrote: Reason for CRM: pt states lungs are still feeling congested. He is hoping prednisone or ABX can be sent in to help him. He states he discussed with Fleet Contras on 10/27/2018.  Pt notes he requested refills 10/27/2018 from the CMA for Ambien, zyrtec, and flonase (all generic). Pt prefers 90 day if possible.   [id number] [place], [place] - [id number] [phone] (Phone) [phone] (Fax)

## 2018-10-30 NOTE — Telephone Encounter (Signed)
Will send prednisone and abx and he should f/u if not improving.   We did not discuss any refills, he was here for a sick visit. Has not had a regular visit in over a year to discuss chronic issues. He can schedule a CPE or regular follow up to discuss his chronic issues.

## 2018-10-30 NOTE — Telephone Encounter (Signed)
Message relayed to patient. Verbalized understanding and denied questions. Pt will call back to schedule CPE.

## 2018-12-11 ENCOUNTER — Other Ambulatory Visit: Payer: Self-pay | Admitting: Nurse Practitioner

## 2018-12-11 ENCOUNTER — Telehealth: Payer: Self-pay | Admitting: Unknown Physician Specialty

## 2018-12-11 MED ORDER — FLUTICASONE PROPIONATE 50 MCG/ACT NA SUSP: NASAL | 0 refills | Status: AC

## 2018-12-11 MED ORDER — MONTELUKAST SODIUM 10 MG PO TABS: 10.0000 mg | ORAL_TABLET | Freq: Every day | ORAL | 0 refills | Status: DC

## 2018-12-11 MED ORDER — CETIRIZINE HCL 10 MG PO TABS: 10.0000 mg | ORAL_TABLET | Freq: Every day | ORAL | 0 refills | Status: DC

## 2018-12-11 NOTE — Telephone Encounter (Signed)
Copied from CRM [id number]. Topic: Quick Communication - Rx Refill/Question >> Dec 11, 2018  2:50 PM Floria Raveling A wrote: Medication: fluticasone (FLONASE) 50 MCG/ACT nasal spray [[id number]] cetirizine (ZYRTEC) 10 MG tablet [[id number]] Singulair- He stated that a tel doc Dr out of [place].  He would like to know if this could be call in as well  Allergies are really bad right now   Has the patient contacted their pharmacy? No. (Agent: If no, request that the patient contact the pharmacy for the refill.) (Agent: If yes, when and what did the pharmacy advise?)  Preferred Pharmacy (with phone number or street name): [facility] [phone] [place], [place] - [address] [phone] (Phone   Agent: Please be advised that RX refills may take up to 3 business days. We ask that you follow-up with your pharmacy.

## 2018-12-11 NOTE — Telephone Encounter (Signed)
Sent!

## 2018-12-11 NOTE — Progress Notes (Signed)
Refills on Flonase, Zyrtec, and Singulair (as recently ordered by telehealth doctor per patient report) sent to pharmacy.

## 2019-01-06 ENCOUNTER — Other Ambulatory Visit: Payer: Self-pay | Admitting: Unknown Physician Specialty

## 2019-01-06 NOTE — Telephone Encounter (Signed)
Requested medication (s) are due for refill today:   Yes  Used as needed  Requested medication (s) are on the active medication list:   Yes  Future visit scheduled:   No  Just seen by Roosvelt Maser 10/27/2018 for influenza A.   Last ordered: 10/27/2018  #2 inhalers.    Forwarded to the provider for review in light of COVID-19 virus and he was recently seen.      Requested Prescriptions  Pending Prescriptions Disp Refills   albuterol (PROVENTIL HFA;VENTOLIN HFA) 108 (90 Base) MCG/ACT inhaler [Pharmacy Med Name: ALBUTEROL HFA (PROAIR) INHALER] 8.5 Inhaler 2    Sig: TAKE 2 PUFFS BY MOUTH EVERY 6 HOURS AS NEEDED FOR WHEEZE OR SHORTNESS OF BREATH     Pulmonology:  Beta Agonists Failed - 01/06/2019 10:42 AM      Failed - One inhaler should last at least one month. If the patient is requesting refills earlier, contact the patient to check for uncontrolled symptoms.      Passed - Valid encounter within last 12 months    Recent Outpatient Visits          2 months ago Influenza A   [facility] Roosvelt Maser [place], [place]   1 year ago Acute non-recurrent maxillary sinusitis   [facility] Gabriel Cirri, NP   2 years ago Acute maxillary sinusitis, recurrence not specified   [facility] Roosvelt Maser [place], [place]

## 2019-01-06 NOTE — Telephone Encounter (Signed)
Will need follow-up appointment for further refills on medications.  Will provide one inhaler at this time.

## 2019-01-10 ENCOUNTER — Other Ambulatory Visit: Payer: Self-pay | Admitting: Nurse Practitioner

## 2019-01-31 ENCOUNTER — Other Ambulatory Visit: Payer: Self-pay | Admitting: Nurse Practitioner

## 2019-02-26 ENCOUNTER — Other Ambulatory Visit: Payer: Self-pay | Admitting: Nurse Practitioner

## 2019-02-26 NOTE — Telephone Encounter (Signed)
Requested Prescriptions  Pending Prescriptions Disp Refills  . albuterol (VENTOLIN HFA) 108 (90 Base) MCG/ACT inhaler [Pharmacy Med Name: ALBUTEROL HFA (PROAIR) INHALER] 8.5 Inhaler 0    Sig: TAKE 2 PUFFS BY MOUTH EVERY 6 HOURS AS NEEDED FOR WHEEZE OR SHORTNESS OF BREATH     Pulmonology:  Beta Agonists Failed - 02/26/2019  1:39 AM      Failed - One inhaler should last at least one month. If the patient is requesting refills earlier, contact the patient to check for uncontrolled symptoms.      Passed - Valid encounter within last 12 months    Recent Outpatient Visits          4 months ago Influenza A   [facility] Roosvelt Maser [place], [place]   1 year ago Acute non-recurrent maxillary sinusitis   [facility] Gabriel Cirri, NP   2 years ago Acute maxillary sinusitis, recurrence not specified   [facility] Roosvelt Maser [place], [place]

## 2019-03-15 ENCOUNTER — Other Ambulatory Visit: Payer: Self-pay | Admitting: Nurse Practitioner

## 2019-06-11 ENCOUNTER — Other Ambulatory Visit: Payer: Self-pay | Admitting: Nurse Practitioner

## 2019-07-20 ENCOUNTER — Other Ambulatory Visit: Payer: Self-pay | Admitting: Nurse Practitioner

## 2019-09-01 ENCOUNTER — Other Ambulatory Visit: Payer: Self-pay | Admitting: Nurse Practitioner

## 2019-09-02 ENCOUNTER — Encounter: Payer: Self-pay | Admitting: Family Medicine

## 2019-09-02 NOTE — Telephone Encounter (Signed)
Called pt to schedule appointment, no answer, left vm, sending letter

## 2019-09-02 NOTE — Telephone Encounter (Signed)
Requested medication (s) are due for refill today:   Yes  Requested medication (s) are on the active medication list:   Yes  Future visit scheduled:   No    Per note needs office visit for further refills   Last ordered: 06/11/2019  #90  0 refills   Requested Prescriptions  Pending Prescriptions Disp Refills   cetirizine (ZYRTEC) 10 MG tablet [Pharmacy Med Name: CETIRIZINE HCL 10 MG TABLET] 90 tablet 0    Sig: TAKE 1 TABLET (10 MG TOTAL) BY MOUTH DAILY. PT MUST MAKE OFFICE VISIT FOR FURTHER REFILLS     Ear, Nose, and Throat:  Antihistamines Passed - 09/01/2019  7:13 PM      Passed - Valid encounter within last 12 months    Recent Outpatient Visits          10 months ago Influenza A   [facility] [place], [place]   2 years ago Acute non-recurrent maxillary sinusitis   [facility] Kathrine Haddock, NP   3 years ago Acute maxillary sinusitis, recurrence not specified   [facility] Merrie Roof [place], [place]

## 2020-01-28 ENCOUNTER — Other Ambulatory Visit: Payer: Self-pay | Admitting: Internal Medicine

## 2020-01-28 DIAGNOSIS — R1310 Dysphagia, unspecified: Secondary | ICD-10-CM

## 2020-02-15 ENCOUNTER — Ambulatory Visit: Payer: BC Managed Care – PPO

## 2020-03-29 ENCOUNTER — Other Ambulatory Visit: Payer: Self-pay | Admitting: Internal Medicine

## 2020-03-29 ENCOUNTER — Ambulatory Visit
Admission: RE | Admit: 2020-03-29 | Discharge: 2020-03-29 | Disposition: A | Discharge status: Home / Self Care | Payer: BC Managed Care – PPO | Source: Ambulatory Visit | Attending: Internal Medicine | Admitting: Internal Medicine

## 2020-03-29 ENCOUNTER — Other Ambulatory Visit: Payer: Self-pay

## 2020-03-29 DIAGNOSIS — R1310 Dysphagia, unspecified: Secondary | ICD-10-CM | POA: Diagnosis not present

## 2022-01-21 IMAGING — RF DG ESOPHAGUS
5 series · 9 of 9 positions shown · non-contrast
Comparison: Chest x-ray 07/15/2017.

CLINICAL DATA: Dysphagia.  Reflux.

EXAM:
ESOPHOGRAM / BARIUM SWALLOW / BARIUM TABLET STUDY
TECHNIQUE: Combined double contrast and single contrast examination performed
using effervescent crystals, thick barium liquid, and thin barium
liquid. The patient was observed with fluoroscopy swallowing a 13 mm
barium sulphate tablet.
FLUOROSCOPY TIME:  Fluoroscopy Time:  1 minutes 30 seconds
Radiation Exposure Index (if provided by the fluoroscopic device):
59.5 mGy

[Series 1: fluoro_barium 2fps_bw · 0.18mm/px · 4 of 7 frames shown (1 of 5)]
[frame 2/7]
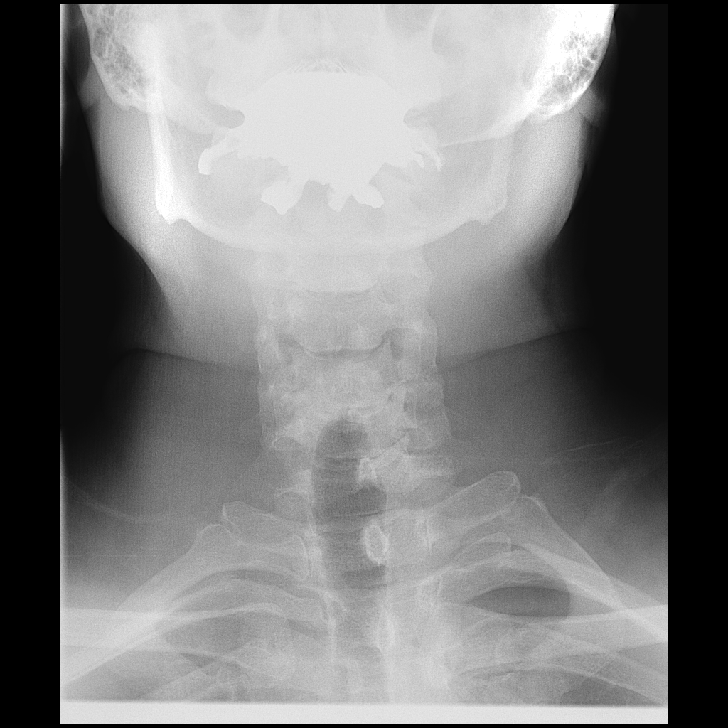
[frame 3/7]
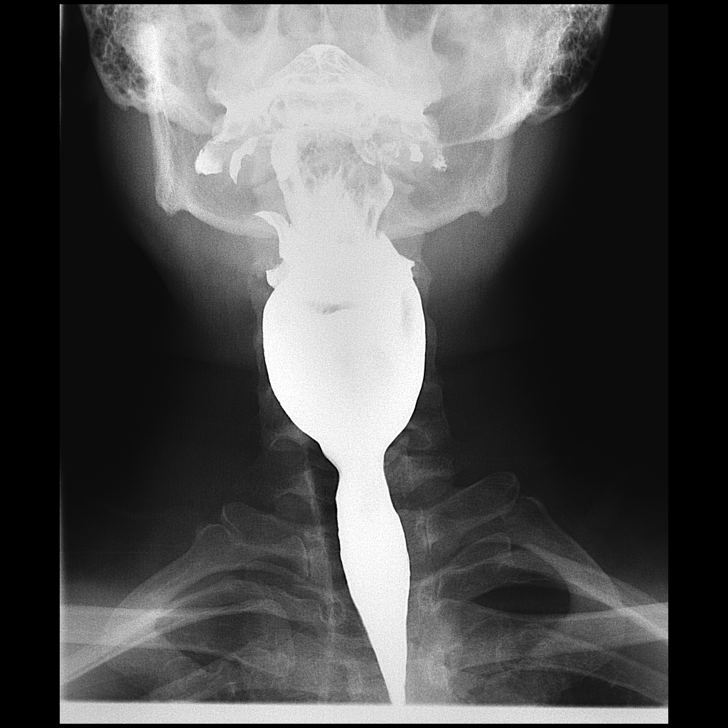
[frame 4/7]
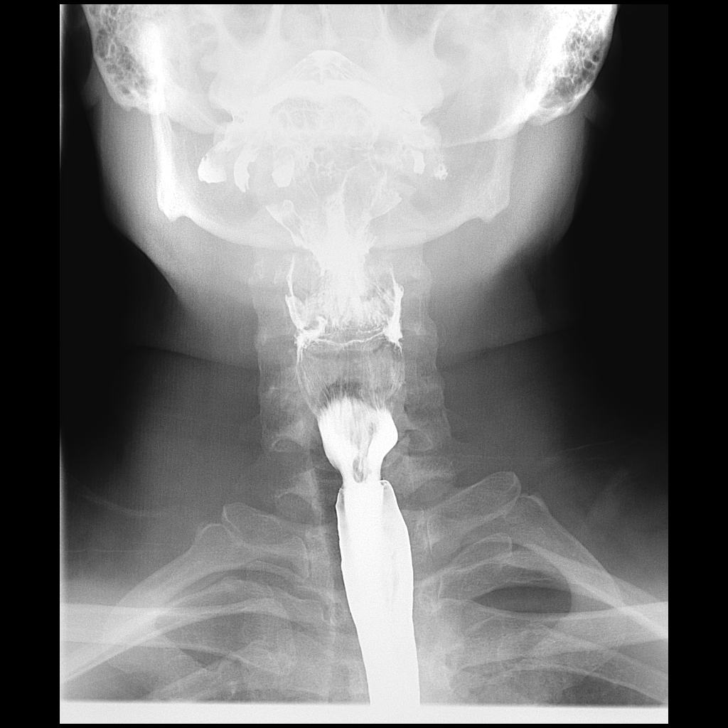
[frame 6/7]
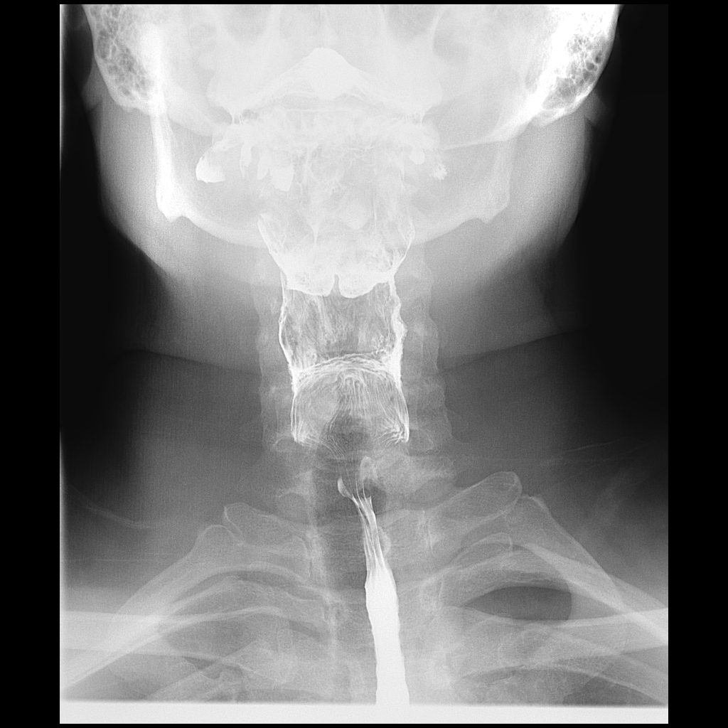

[Series 4: fluoro_barium 2fps_bw · 0.18mm/px · 1 of 1 slices shown (2 of 5)]
[im 1/1]
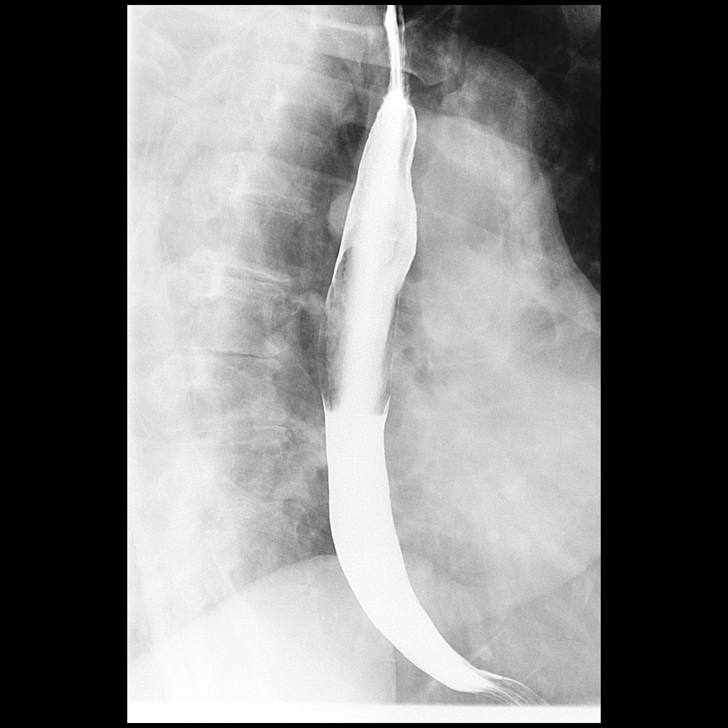

[Series 5: fluoro_barium 2fps_bw · 0.18mm/px · 1 of 1 slices shown (3 of 5)]
[im 1/1]
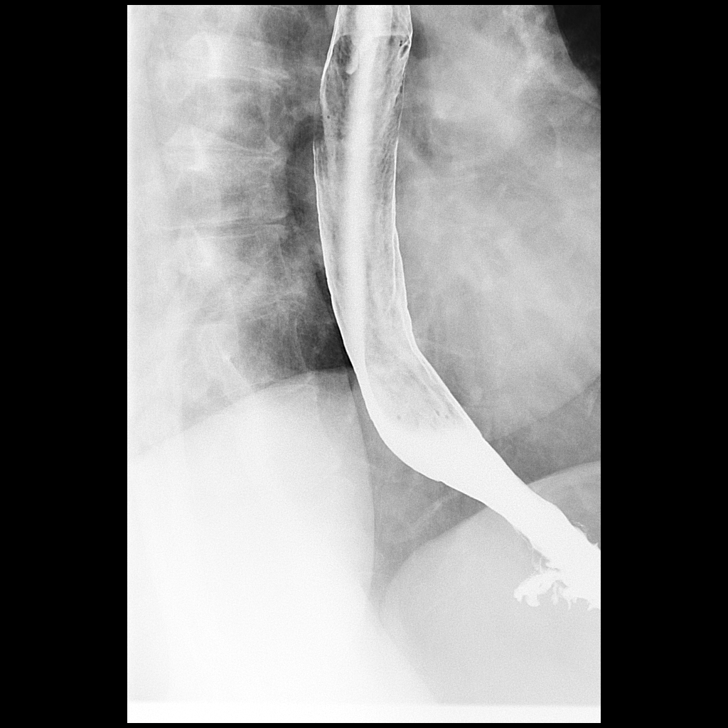

[Series 6: fluoro_barium 2fps_bw · 0.18mm/px · 1 of 1 slices shown (4 of 5)]
[im 1/1]
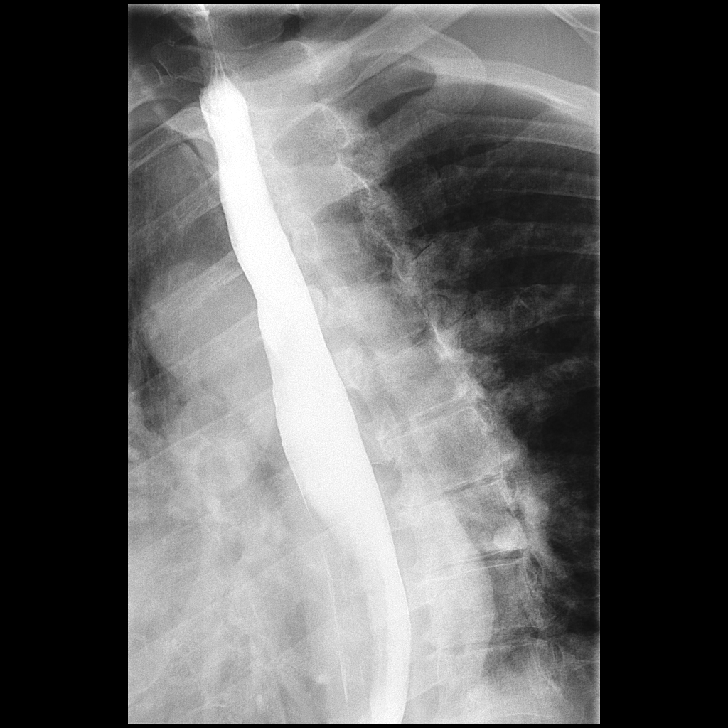

[Series 8: fluoro_barium 2fps_bw · 0.18mm/px · 2 of 2 frames shown (5 of 5)]
[frame 1/2]
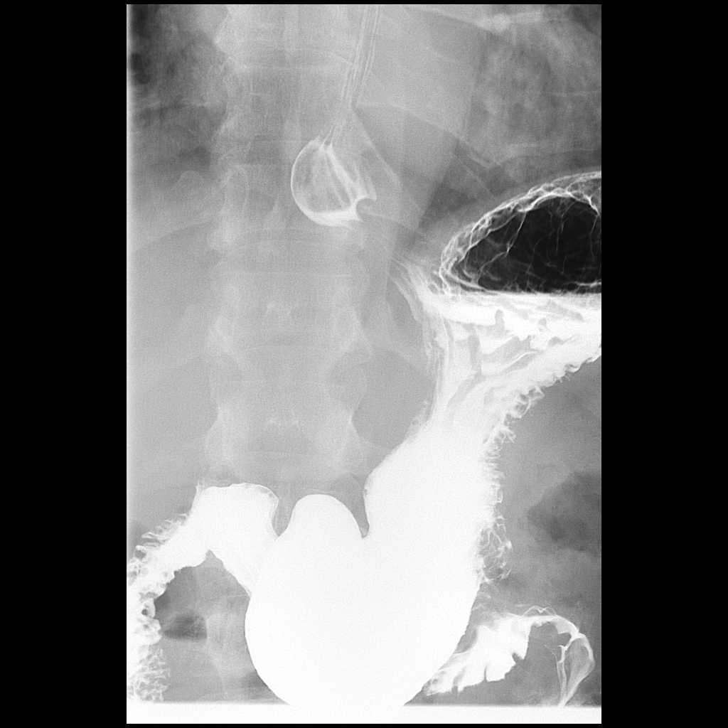
[frame 2/2]
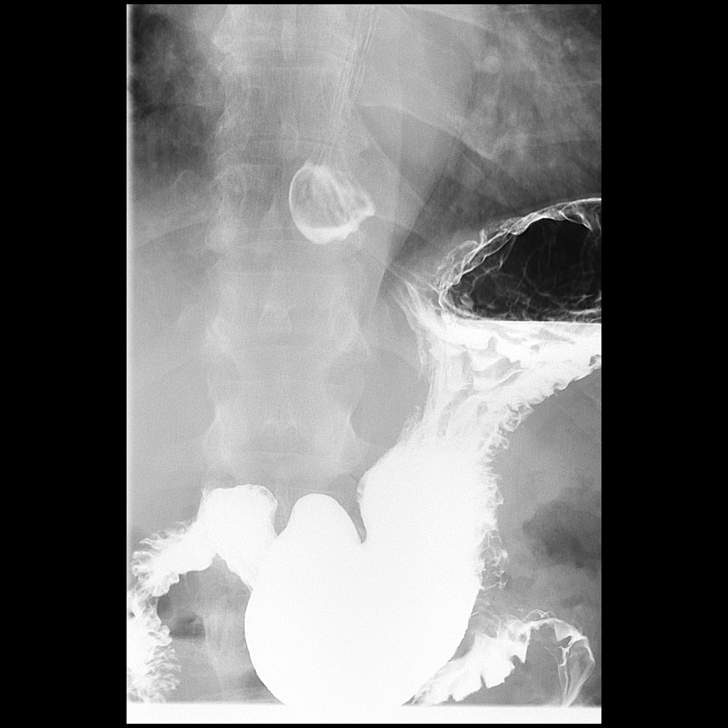

[9 of 9 positions shown; findings below may reference images not displayed]

FINDINGS: Cervical esophagus is widely patent. Thoracic esophagus is widely
patent. No evidence of obstruction. Peristalsis normal. No
significant reflux noted. Standard barium tablet passes normally.
IMPRESSION: Negative exam. No significant abnormality identified. No reflux
noted.

## 2022-02-05 ENCOUNTER — Inpatient Hospital Stay: Payer: BC Managed Care – PPO

## 2022-02-05 ENCOUNTER — Inpatient Hospital Stay: Payer: BC Managed Care – PPO | Admitting: Internal Medicine

## 2022-02-05 ENCOUNTER — Ambulatory Visit: Payer: BLUE CROSS/BLUE SHIELD | Admitting: Urology
